# Patient Record
Sex: Female | Born: 1965
Health system: Southern US, Community
[De-identification: ages and names within clinical notes are randomized; demographics above are authoritative.]

## PROBLEM LIST (undated history)

## (undated) DIAGNOSIS — F329 Major depressive disorder, single episode, unspecified: Secondary | ICD-10-CM

## (undated) DIAGNOSIS — F32A Depression, unspecified: Secondary | ICD-10-CM

## (undated) DIAGNOSIS — G43909 Migraine, unspecified, not intractable, without status migrainosus: Secondary | ICD-10-CM

## (undated) HISTORY — DX: Migraine, unspecified, not intractable, without status migrainosus: G43.909

## (undated) HISTORY — DX: Major depressive disorder, single episode, unspecified: F32.9

## (undated) HISTORY — DX: Depression, unspecified: F32.A

---

## 2011-10-27 ENCOUNTER — Other Ambulatory Visit (HOSPITAL_COMMUNITY)
Admission: RE | Admit: 2011-10-27 | Discharge: 2011-10-27 | Disposition: A | Discharge status: Home / Self Care | Payer: 59 | Source: Ambulatory Visit | Attending: Obstetrics and Gynecology | Admitting: Obstetrics and Gynecology

## 2011-10-27 DIAGNOSIS — Z1159 Encounter for screening for other viral diseases: Secondary | ICD-10-CM | POA: Insufficient documentation

## 2011-10-27 DIAGNOSIS — Z01419 Encounter for gynecological examination (general) (routine) without abnormal findings: Secondary | ICD-10-CM | POA: Insufficient documentation

## 2013-01-26 ENCOUNTER — Ambulatory Visit (INDEPENDENT_AMBULATORY_CARE_PROVIDER_SITE_OTHER): Payer: No Typology Code available for payment source | Admitting: Family Medicine

## 2013-01-26 VITALS — BP 104/64 | HR 68 | Temp 98.2°F | Resp 16 | Ht 67.0 in | Wt 152.0 lb

## 2013-01-26 DIAGNOSIS — R350 Frequency of micturition: Secondary | ICD-10-CM

## 2013-01-26 DIAGNOSIS — R3989 Other symptoms and signs involving the genitourinary system: Secondary | ICD-10-CM

## 2013-01-26 LAB — POCT URINALYSIS DIPSTICK
Glucose, UA: NEGATIVE
Ketones, UA: NEGATIVE
Leukocytes, UA: NEGATIVE
Spec Grav, UA: 1.02

## 2013-01-26 LAB — POCT UA - MICROSCOPIC ONLY

## 2013-01-26 MED ORDER — CIPROFLOXACIN HCL 250 MG PO TABS: 250.0000 mg | ORAL_TABLET | Freq: Two times a day (BID) | ORAL | Status: DC

## 2013-01-26 NOTE — Progress Notes (Signed)
 [facility]:  Office Visit  Chief Complaint:  Chief Complaint  Patient presents with  . Urinary Tract Infection    5 days    HPI: Lynn Juarez is a 47 y.o. female who complains of  5 day history of UTI sxs--increase frequency and pressure. Denies dysuira, n/v abd pain.  No fevers, chills, backpain. History of UTI. Tried Cyctex OTC without relief. 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 She used to work at [employer].   Past Medical History  Diagnosis Date  . Depression    History reviewed. No pertinent past surgical history. History   Social History  . Marital Status: Single    Spouse Name: N/A    Number of Children: N/A  . Years of Education: N/A   Social History Main Topics  . Smoking status: Never Smoker   . Smokeless tobacco: None  . Alcohol Use: No  . Drug Use: No  . Sexually Active: Yes    Birth Control/ Protection: IUD   Other Topics Concern  . None   Social History Narrative  . None   Family History  Problem Relation Age of Onset  . Cancer Father    No Known Allergies Prior to Admission medications   Medication Sig Start Date End Date Taking? Authorizing Provider  buPROPion (WELLBUTRIN XL) 150 MG 24 hr tablet Take 150 mg by mouth daily.   Yes Historical Provider, MD     ROS: The patient denies fevers, chills, night sweats, unintentional weight loss, chest pain, palpitations, wheezing, dyspnea on exertion, nausea, vomiting, abdominal pain, dysuria, hematuria, melena, numbness, weakness, or tingling.   All other systems have been reviewed and were otherwise negative with the exception of those mentioned in the HPI and as above.    PHYSICAL EXAM: Filed Vitals:   01/26/13 1721  BP: 104/64  Pulse: 68  Temp: 98.2 F (36.8 C)  Resp: 16   Filed Vitals:   01/26/13 1721  Height: 5\' 7"  (1.702 m)  Weight: 152 lb (68.947 kg)   Body mass index is 23.8 kg/(m^2).  General: Alert, no acute distress HEENT:  Normocephalic,  atraumatic, oropharynx patent.  Cardiovascular:  Regular rate and rhythm, no rubs murmurs or gallops.  No Carotid bruits, radial pulse intact. No pedal edema.  Respiratory: Clear to auscultation bilaterally.  No wheezes, rales, or rhonchi.  No cyanosis, no use of accessory musculature GI: No organomegaly, abdomen is soft and non-tender, positive bowel sounds.  No masses. Skin: No rashes. Neurologic: Facial musculature symmetric. Psychiatric: Patient is appropriate throughout our interaction. Lymphatic: No cervical lymphadenopathy Musculoskeletal: Gait intact. No CVA tenderness   LABS: Results for orders placed in visit on 01/26/13  POCT UA - MICROSCOPIC ONLY      Result Value Range   WBC, Ur, HPF, POC 0-2     RBC, urine, microscopic 0-1     Bacteria, U Microscopic trace     Mucus, UA neg     Epithelial cells, urine per micros 0-2     Crystals, Ur, HPF, POC neg     Casts, Ur, LPF, POC neg     Yeast, UA neg    POCT URINALYSIS DIPSTICK      Result Value Range   Color, UA yellow     Clarity, UA clear     Glucose, UA neg     Bilirubin, UA neg     Ketones, UA neg     Spec Grav, UA 1.020  Blood, UA neg     pH, UA 7.0     Protein, UA neg     Urobilinogen, UA 0.2     Nitrite, UA neg     Leukocytes, UA Negative       EKG/XRAY:   Primary read interpreted by Dr. Conley Rolls at [facility].   ASSESSMENT/PLAN: Encounter Diagnoses  Name Primary?  . Urinary frequency Yes  . Sensation of pressure in bladder area    No UTI currently Will cx urine She will be given Cipro 250 mg BID x 3 days to take if sxs worsen F/u prn    ,  PHUONG, DO 01/26/2013 5:38 PM

## 2013-01-28 LAB — URINE CULTURE
Colony Count: NO GROWTH
Organism ID, Bacteria: NO GROWTH

## 2013-07-28 ENCOUNTER — Other Ambulatory Visit: Payer: Self-pay | Admitting: Family Medicine

## 2013-07-28 ENCOUNTER — Ambulatory Visit
Admission: RE | Admit: 2013-07-28 | Discharge: 2013-07-28 | Disposition: A | Discharge status: Home / Self Care | Payer: No Typology Code available for payment source | Source: Ambulatory Visit | Attending: Family Medicine | Admitting: Family Medicine

## 2013-07-28 DIAGNOSIS — M5431 Sciatica, right side: Secondary | ICD-10-CM

## 2018-03-28 MED FILL — VYVANSE 30 MG CAPSULE: 30 | 30 days supply | Qty: 30 | Fill #0

## 2018-04-26 MED FILL — buPROPion HCL ER (XL) 150 M: 150 | 30 days supply | Qty: 30 | Fill #0

## 2018-04-28 MED FILL — VYVANSE 30 MG CAPSULE: 30 | 30 days supply | Qty: 30 | Fill #0

## 2018-05-25 MED FILL — buPROPion HCL ER (XL) 150 M: 150 | 30 days supply | Qty: 30 | Fill #1

## 2018-05-27 MED FILL — VYVANSE 30 MG CAPSULE: 30 | 30 days supply | Qty: 30 | Fill #0

## 2018-06-27 MED FILL — buPROPion HCL ER (XL) 150 M: 150 | 60 days supply | Qty: 60 | Fill #0

## 2018-06-27 MED FILL — VYVANSE 30 MG CAPSULE: 30 | 30 days supply | Qty: 30 | Fill #0

## 2018-07-28 MED FILL — buPROPion HCL ER (XL) 150 M: 150 | 60 days supply | Qty: 60 | Fill #1

## 2018-07-28 MED FILL — VYVANSE 30 MG CAPSULE: 30 | 30 days supply | Qty: 30 | Fill #0

## 2018-09-01 MED FILL — VYVANSE 30 MG CAPSULE: 30 | 30 days supply | Qty: 30 | Fill #0

## 2018-09-02 MED FILL — buPROPion HCL ER (XL) 300 M: 300 | 30 days supply | Qty: 30 | Fill #0

## 2018-09-26 DIAGNOSIS — F338 Other recurrent depressive disorders: Secondary | ICD-10-CM | POA: Diagnosis not present

## 2018-09-26 DIAGNOSIS — F902 Attention-deficit hyperactivity disorder, combined type: Secondary | ICD-10-CM | POA: Diagnosis not present

## 2018-09-26 DIAGNOSIS — Z79899 Other long term (current) drug therapy: Secondary | ICD-10-CM | POA: Diagnosis not present

## 2018-09-26 MED FILL — buPROPion HCL ER (XL) 150 M: 150 | 30 days supply | Qty: 60 | Fill #0

## 2018-09-29 ENCOUNTER — Encounter: Payer: Self-pay | Admitting: Certified Nurse Midwife

## 2018-10-03 ENCOUNTER — Inpatient Hospital Stay (HOSPITAL_BASED_OUTPATIENT_CLINIC_OR_DEPARTMENT_OTHER): Admission: RE | Admit: 2018-10-03 | Payer: No Typology Code available for payment source | Source: Ambulatory Visit

## 2018-10-03 MED FILL — VYVANSE 30 MG CAPSULE: 30 | 30 days supply | Qty: 30 | Fill #0

## 2018-10-07 ENCOUNTER — Other Ambulatory Visit: Payer: Self-pay

## 2018-10-07 ENCOUNTER — Ambulatory Visit: Payer: 59 | Admitting: Certified Nurse Midwife

## 2018-10-07 ENCOUNTER — Encounter: Payer: Self-pay | Admitting: Certified Nurse Midwife

## 2018-10-07 VITALS — BP 120/80 | HR 64 | Resp 16 | Ht 65.75 in | Wt 148.0 lb

## 2018-10-07 DIAGNOSIS — N6310 Unspecified lump in the right breast, unspecified quadrant: Secondary | ICD-10-CM | POA: Diagnosis not present

## 2018-10-07 NOTE — Progress Notes (Signed)
[facility] appointment for mammogram for Bilateral Breast and possible R Breast Ultrasound 10/18/2018 at 1300.  Will call [facility] locally and see if earlier appointment can be made.

## 2018-10-07 NOTE — Progress Notes (Signed)
   Subjective:   53 y.o. Single Caucasian female here to establish gyn care and  presents for evaluation of right breast mass only.Has not had Gyn exam since 2015 with IUD removal. No history of abnormal paps. Periods normal,no changes. Onset of breast  symptoms was noted 7 days ago by appearance when doing SBE. Breasts are always tender, has decreased green tea and with tenderness becoming less apparent. Always sensitive to touch or exam."Does not like breast exams".. Patient sought evaluation because of breast lump felt after noted appearance change in mirror.   Contributing factors include none,. Denies no health issues that would contribute to finding.. Patient denies history of trauma, bites, or injuries. Last mammogram was 2018 with asymmetry with benign finding.( report in Care everywhere 02/23/17) No other health concerns today. 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   Review of Systems  Review of Systems  Constitutional: Negative.   HENT: Negative.   Eyes: Negative.   Respiratory: Negative.   Cardiovascular: Negative.        Breast tenderness with mass in right  Gastrointestinal: Negative.   Genitourinary: Negative.   Musculoskeletal: Negative.   Skin: Negative.   Neurological: Negative.        History of depression  Endo/Heme/Allergies: Negative.   Psychiatric/Behavioral: Positive for depression.       History of depression on medication with MD management     Objective:   General appearance: alert, cooperative and appears stated age  Skin: warm and dry Physical Exam Cardiovascular:     Rate and Rhythm: Normal rate.  Pulmonary:     Effort: Pulmonary effort is normal.  Chest:     Breasts: Breasts are symmetrical.        Right: Mass and tenderness present. No nipple discharge or skin change.        Left: Normal. No mass, nipple discharge or skin change.    Lymphadenopathy:     Upper Body:     Right upper body: No axillary adenopathy.     Left upper body: No axillary  adenopathy.  Neurological:     Mental Status: She is alert.        Assessment:   ASSESSMENT:Patient is diagnosed with right breast mass with tenderness  Gyn care absent for 5 years.   Plan:  Discussed finding with patient and recommendations of diagnostic mammogram and [place] for evaluation. Patient agreeable. Patient will be scheduled prior to leaving for diagnostic mammogram and [place]. Questions addressed. Discussed importance of gyn care and evaluation for cervical cancer recommended with Pap smear. Discussed would be happy to work with her regarding if she desires. Patient will consider if she feels needed at this point. Questions addressed.  Rv prn

## 2018-10-11 ENCOUNTER — Encounter: Payer: Self-pay | Admitting: Certified Nurse Midwife

## 2018-10-11 DIAGNOSIS — N6315 Unspecified lump in the right breast, overlapping quadrants: Secondary | ICD-10-CM | POA: Diagnosis not present

## 2018-10-11 DIAGNOSIS — N6489 Other specified disorders of breast: Secondary | ICD-10-CM | POA: Diagnosis not present

## 2018-10-11 DIAGNOSIS — N6313 Unspecified lump in the right breast, lower outer quadrant: Secondary | ICD-10-CM | POA: Diagnosis not present

## 2018-10-11 DIAGNOSIS — N6314 Unspecified lump in the right breast, lower inner quadrant: Secondary | ICD-10-CM | POA: Diagnosis not present

## 2018-10-13 ENCOUNTER — Telehealth: Payer: Self-pay | Admitting: Emergency Medicine

## 2018-10-13 NOTE — Telephone Encounter (Signed)
Yes call back if no appt. In 4 weeks.  I will put in my reminder list also

## 2018-10-13 NOTE — Telephone Encounter (Signed)
Can speak with any nurse at return call. Needs breast check appointment for followup.    Contacted patient per Leota Sauerseborah Leonard CNM.  Imaging done at [facility] reviewed from 10/11/2018.   Debbi would like patient to return in two weeks for breast check of mass that she felt on palpation.  Just needs office visit for recheck R breast.   Please schedule.   Message left to return call to [place] at [phone].

## 2018-10-13 NOTE — Telephone Encounter (Signed)
Spoke with patient.  Reviewed mammogram and need for follow up breast check.  Pt declines to schedule appointment at this time.   She is advised of reason for needing breast check, follow up to ensure area has resolved or if not, referral to breast surgeon. Pt advised to continue with monthly self breast examinations and call back if would like to schedule breast check.   Routing to provider.  Okay to close?

## 2018-10-14 NOTE — Telephone Encounter (Signed)
Noted message.  Encounter closed.

## 2018-11-04 MED FILL — buPROPion HCL ER (XL) 150 M: 150 | 30 days supply | Qty: 60 | Fill #1

## 2018-11-04 MED FILL — VYVANSE 30 MG CAPSULE: 30 | 30 days supply | Qty: 30 | Fill #0

## 2019-05-17 NOTE — Progress Notes (Deleted)
53 y.o. [id number] Single  {Race/ethnicity:17218} Fe here for annual exam.    No LMP recorded.          Sexually active: {yes no:314532}  The current method of family planning is {contraception:315051}.    Exercising: {yes no:314532}  {types:19826} Smoker:  {YES NO:22349}  ROS  Health Maintenance: Pap:  *** History of Abnormal Pap: {YES NO:22349} MMG:  10-11-2018 & u/s neg Self Breast exams: {YES NO:22349} Colonoscopy:  *** BMD:   none TDaP:  *** Shingles: no Pneumonia: no Hep C and HIV: *** Labs: ***   reports that she has never smoked. She has never used smokeless tobacco. She reports that she does not drink alcohol or use drugs.  Past Medical History:  Diagnosis Date  . Depression   . Migraines     No past surgical history on file.  Current Outpatient Medications  Medication Sig Dispense Refill  . buPROPion (WELLBUTRIN XL) 300 MG 24 hr tablet     . VYVANSE 30 MG capsule      No current facility-administered medications for this visit.     Family History  Problem Relation Age of Onset  . Lung cancer Father   . Stroke Maternal Grandmother     ROS:  Pertinent items are noted in HPI.  Otherwise, a comprehensive ROS was negative.  Exam:   There were no vitals taken for this visit.   Ht Readings from Last 3 Encounters:  10/07/18 5' 5.75" (1.67 m)  01/26/13 5\' 7"  (1.702 m)    General appearance: alert, cooperative and appears stated age Head: Normocephalic, without obvious abnormality, atraumatic Neck: no adenopathy, supple, symmetrical, trachea midline and thyroid {EXAM; THYROID:18604} Lungs: clear to auscultation bilaterally Breasts: {Exam; breast:13139::"normal appearance, no masses or tenderness"} Heart: regular rate and rhythm Abdomen: soft, non-tender; no masses,  no organomegaly Extremities: extremities normal, atraumatic, no cyanosis or edema Skin: Skin color, texture, turgor normal. No rashes or lesions Lymph nodes: Cervical, supraclavicular, and  axillary nodes normal. No abnormal inguinal nodes palpated Neurologic: Grossly normal   Pelvic: External genitalia:  no lesions              Urethra:  normal appearing urethra with no masses, tenderness or lesions              Bartholin's and Skene's: normal                 Vagina: normal appearing vagina with normal color and discharge, no lesions              Cervix: {exam; cervix:14595}              Pap taken: {yes no:314532} Bimanual Exam:  Uterus:  {exam; uterus:12215}              Adnexa: {exam; adnexa:12223}               Rectovaginal: Confirms               Anus:  normal sphincter tone, no lesions  Chaperone present: ***  A:  Well Woman with normal exam  P:   Reviewed health and wellness pertinent to exam  Pap smear: {YES NO:22349}  {plan; gyn:5269::"mammogram","pap smear","return annually or prn"}  An After Visit Summary was printed and given to the patient.

## 2019-05-24 ENCOUNTER — Ambulatory Visit: Payer: 59 | Admitting: Certified Nurse Midwife

## 2019-06-05 ENCOUNTER — Ambulatory Visit: Payer: Self-pay | Admitting: Certified Nurse Midwife

## 2019-06-19 ENCOUNTER — Other Ambulatory Visit: Payer: Self-pay | Admitting: *Deleted

## 2019-06-19 ENCOUNTER — Other Ambulatory Visit (HOSPITAL_COMMUNITY)
Admission: RE | Admit: 2019-06-19 | Discharge: 2019-06-19 | Disposition: A | Discharge status: Home / Self Care | Payer: Federal, State, Local not specified - PPO | Source: Ambulatory Visit | Attending: Obstetrics & Gynecology | Admitting: Obstetrics & Gynecology

## 2019-06-19 ENCOUNTER — Encounter: Payer: Self-pay | Admitting: Certified Nurse Midwife

## 2019-06-19 ENCOUNTER — Ambulatory Visit: Payer: Federal, State, Local not specified - PPO | Admitting: Certified Nurse Midwife

## 2019-06-19 ENCOUNTER — Other Ambulatory Visit: Payer: Self-pay

## 2019-06-19 VITALS — BP 110/70 | HR 64 | Temp 97.1°F | Resp 16 | Ht 65.75 in | Wt 145.0 lb

## 2019-06-19 DIAGNOSIS — Z124 Encounter for screening for malignant neoplasm of cervix: Secondary | ICD-10-CM | POA: Diagnosis not present

## 2019-06-19 DIAGNOSIS — Z01419 Encounter for gynecological examination (general) (routine) without abnormal findings: Secondary | ICD-10-CM

## 2019-06-19 DIAGNOSIS — Z0000 Encounter for general adult medical examination without abnormal findings: Secondary | ICD-10-CM

## 2019-06-19 DIAGNOSIS — E041 Nontoxic single thyroid nodule: Secondary | ICD-10-CM

## 2019-06-19 DIAGNOSIS — E559 Vitamin D deficiency, unspecified: Secondary | ICD-10-CM | POA: Diagnosis not present

## 2019-06-19 DIAGNOSIS — N951 Menopausal and female climacteric states: Secondary | ICD-10-CM

## 2019-06-19 DIAGNOSIS — Z1211 Encounter for screening for malignant neoplasm of colon: Secondary | ICD-10-CM

## 2019-06-19 NOTE — Patient Instructions (Addendum)
EXERCISE AND DIET:  We recommended that you start or continue a regular exercise program for good health. Regular exercise means any activity that makes your heart beat faster and makes you sweat.  We recommend exercising at least 30 minutes per day at least 3 days a week, preferably 4 or 5.  We also recommend a diet low in fat and sugar.  Inactivity, poor dietary choices and obesity can cause diabetes, heart attack, stroke, and kidney damage, among others.    ALCOHOL AND SMOKING:  Women should limit their alcohol intake to no more than 7 drinks/beers/glasses of wine (combined, not each!) per week. Moderation of alcohol intake to this level decreases your risk of breast cancer and liver damage. And of course, no recreational drugs are part of a healthy lifestyle.  And absolutely no smoking or even second hand smoke. Most people know smoking can cause heart and lung diseases, but did you know it also contributes to weakening of your bones? Aging of your skin?  Yellowing of your teeth and nails?  CALCIUM AND VITAMIN D:  Adequate intake of calcium and Vitamin D are recommended.  The recommendations for exact amounts of these supplements seem to change often, but generally speaking 600 mg of calcium (either carbonate or citrate) and 800 units of Vitamin D per day seems prudent. Certain women may benefit from higher intake of Vitamin D.  If you are among these women, your doctor will have told you during your visit.    PAP SMEARS:  Pap smears, to check for cervical cancer or precancers,  have traditionally been done yearly, although recent scientific advances have shown that most women can have pap smears less often.  However, every woman still should have a physical exam from her gynecologist every year. It will include a breast check, inspection of the vulva and vagina to check for abnormal growths or skin changes, a visual exam of the cervix, and then an exam to evaluate the size and shape of the uterus and  ovaries.  And after 51 years of age, a rectal exam is indicated to check for rectal cancers. We will also provide age appropriate advice regarding health maintenance, like when you should have certain vaccines, screening for sexually transmitted diseases, bone density testing, colonoscopy, mammograms, etc.   MAMMOGRAMS:  All women over 40 years old should have a yearly mammogram. Many facilities now offer a "3D" mammogram, which may cost around $50 extra out of pocket. If possible,  we recommend you accept the option to have the 3D mammogram performed.  It both reduces the number of women who will be called back for extra views which then turn out to be normal, and it is better than the routine mammogram at detecting truly abnormal areas.    COLONOSCOPY:  Colonoscopy to screen for colon cancer is recommended for all women at age 50.  We know, you hate the idea of the prep.  We agree, BUT, having colon cancer and not knowing it is worse!!  Colon cancer so often starts as a polyp that can be seen and removed at colonscopy, which can quite literally save your life!  And if your first colonoscopy is normal and you have no family history of colon cancer, most women don't have to have it again for 10 years.  Once every ten years, you can do something that may end up saving your life, right?  We will be happy to help you get it scheduled when you are ready.    Be sure to check your insurance coverage so you understand how much it will cost.  It may be covered as a preventative service at no cost, but you should check your particular policy.     Levonorgestrel intrauterine device (IUD) What is this medicine? LEVONORGESTREL IUD (LEE voe nor jes trel) is a contraceptive (birth control) device. The device is placed inside the uterus by a healthcare professional. It is used to prevent pregnancy. This device can also be used to treat heavy bleeding that occurs during your period. This medicine may be used for other  purposes; ask your health care provider or pharmacist if you have questions. COMMON BRAND NAME(S): Kyleena, LILETTA, Mirena, Skyla What should I tell my health care provider before I take this medicine? They need to know if you have any of these conditions:  abnormal Pap smear  cancer of the breast, uterus, or cervix  diabetes  endometritis  genital or pelvic infection now or in the past  have more than one sexual partner or your partner has more than one partner  heart disease  history of an ectopic or tubal pregnancy  immune system problems  IUD in place  liver disease or tumor  problems with blood clots or take blood-thinners  seizures  use intravenous drugs  uterus of unusual shape  vaginal bleeding that has not been explained  an unusual or allergic reaction to levonorgestrel, other hormones, silicone, or polyethylene, medicines, foods, dyes, or preservatives  pregnant or trying to get pregnant  breast-feeding How should I use this medicine? This device is placed inside the uterus by a health care professional. Talk to your pediatrician regarding the use of this medicine in children. Special care may be needed. Overdosage: If you think you have taken too much of this medicine contact a poison control center or emergency room at once. NOTE: This medicine is only for you. Do not share this medicine with others. What if I miss a dose? This does not apply. Depending on the brand of device you have inserted, the device will need to be replaced every 3 to 6 years if you wish to continue using this type of birth control. What may interact with this medicine? Do not take this medicine with any of the following medications:  amprenavir  bosentan  fosamprenavir This medicine may also interact with the following medications:  aprepitant  armodafinil  barbiturate medicines for inducing sleep or treating  seizures  bexarotene  boceprevir  griseofulvin  medicines to treat seizures like carbamazepine, ethotoin, felbamate, oxcarbazepine, phenytoin, topiramate  modafinil  pioglitazone  rifabutin  rifampin  rifapentine  some medicines to treat HIV infection like atazanavir, efavirenz, indinavir, lopinavir, nelfinavir, tipranavir, ritonavir  St. John's wort  warfarin This list may not describe all possible interactions. Give your health care provider a list of all the medicines, herbs, non-prescription drugs, or dietary supplements you use. Also tell them if you smoke, drink alcohol, or use illegal drugs. Some items may interact with your medicine. What should I watch for while using this medicine? Visit your doctor or health care professional for regular check ups. See your doctor if you or your partner has sexual contact with others, becomes HIV positive, or gets a sexual transmitted disease. This product does not protect you against HIV infection (AIDS) or other sexually transmitted diseases. You can check the placement of the IUD yourself by reaching up to the top of your vagina with clean fingers to feel the threads. Do not pull   on the threads. It is a good habit to check placement after each menstrual period. Call your doctor right away if you feel more of the IUD than just the threads or if you cannot feel the threads at all. The IUD may come out by itself. You may become pregnant if the device comes out. If you notice that the IUD has come out use a backup birth control method like condoms and call your health care provider. Using tampons will not change the position of the IUD and are okay to use during your period. This IUD can be safely scanned with magnetic resonance imaging (MRI) only under specific conditions. Before you have an MRI, tell your healthcare provider that you have an IUD in place, and which type of IUD you have in place. What side effects may I notice from  receiving this medicine? Side effects that you should report to your doctor or health care professional as soon as possible:  allergic reactions like skin rash, itching or hives, swelling of the face, lips, or tongue  fever, flu-like symptoms  genital sores  high blood pressure  no menstrual period for 6 weeks during use  pain, swelling, warmth in the leg  pelvic pain or tenderness  severe or sudden headache  signs of pregnancy  stomach cramping  sudden shortness of breath  trouble with balance, talking, or walking  unusual vaginal bleeding, discharge  yellowing of the eyes or skin Side effects that usually do not require medical attention (report to your doctor or health care professional if they continue or are bothersome):  acne  breast pain  change in sex drive or performance  changes in weight  cramping, dizziness, or faintness while the device is being inserted  headache  irregular menstrual bleeding within first 3 to 6 months of use  nausea This list may not describe all possible side effects. Call your doctor for medical advice about side effects. You may report side effects to FDA at 1-800-FDA-1088. Where should I keep my medicine? This does not apply. NOTE: This sheet is a summary. It may not cover all possible information. If you have questions about this medicine, talk to your doctor, pharmacist, or health care provider.  2020 Elsevier/Gold Standard (2018-07-12 13:22:01)  

## 2019-06-19 NOTE — Progress Notes (Addendum)
53 y.o. [id number] Single  Caucasian Fe here for annual exam. Periods regular, monthly 1-2 heavy days and then light, no cramping. Eating well and drinking adequate amounts of water. No missed periods in past year. Desires IUD insertion for cycle control and transition to menopause. She has had Mirena before and feels this is good choice again. No other health issues today. Screening labs, no PCP established as of yet.  Patient's last menstrual period was 06/05/2019.          Sexually active: No.  The current method of family planning is abstinence.    Exercising: Yes.    walking & free weights Smoker:  no  Review of Systems  Constitutional: Negative.   HENT: Negative.   Eyes: Negative.   Respiratory: Negative.   Cardiovascular: Negative.   Gastrointestinal: Negative.   Genitourinary: Negative.   Musculoskeletal: Negative.   Skin: Negative.   Neurological: Negative.   Endo/Heme/Allergies: Negative.   Psychiatric/Behavioral: Negative.     Health Maintenance: Pap:  102yrs ago per patient History of Abnormal Pap: no per patient MMG:  10-11-2018 bilateral & bilateral u/s birads 1:neg Self Breast exams: yes Colonoscopy:  none BMD:   none TDaP:  UTD maybe done last year Shingles: no Pneumonia: no Hep C and HIV: not done Labs: if needed.   reports that she has never smoked. She has never used smokeless tobacco. She reports that she does not drink alcohol or use drugs.  Past Medical History:  Diagnosis Date  . Depression   . Migraines     History reviewed. No pertinent surgical history.  Current Outpatient Medications  Medication Sig Dispense Refill  . buPROPion (WELLBUTRIN XL) 300 MG 24 hr tablet     . Multiple Vitamin (MULTI-VITAMIN) tablet Take by mouth as needed.    Marland Kitchen VYVANSE 50 MG capsule      No current facility-administered medications for this visit.     Family History  Problem Relation Age of Onset  . Lung cancer Father   . Stroke Maternal Grandmother     ROS:   Pertinent items are noted in HPI.  Otherwise, a comprehensive ROS was negative.  Exam:   BP 110/70   Pulse 64   Temp (!) 97.1 F (36.2 C) (Skin)   Resp 16   Ht 5' 5.75" (1.67 m)   Wt 145 lb (65.8 kg)   LMP 06/05/2019   BMI 23.58 kg/m  Height: 5' 5.75" (167 cm) Ht Readings from Last 3 Encounters:  06/19/19 5' 5.75" (1.67 m)  10/07/18 5' 5.75" (1.67 m)  01/26/13 5\' 7"  (1.702 m)    General appearance: alert, cooperative and appears stated age Head: Normocephalic, without obvious abnormality, atraumatic Neck: no adenopathy, supple, symmetrical, trachea midline and thyroid small ? Nodule noted on right. Lungs: clear to auscultation bilaterally Breasts: normal appearance, no masses or tenderness, No nipple retraction or dimpling, No nipple discharge or bleeding, No axillary or supraclavicular adenopathy Heart: regular rate and rhythm Abdomen: soft, non-tender; no masses,  no organomegaly Extremities: extremities normal, atraumatic, no cyanosis or edema Skin: Skin color, texture, turgor normal. No rashes or lesions Lymph nodes: Cervical, supraclavicular, and axillary nodes normal. No abnormal inguinal nodes palpated Neurologic: Grossly normal   Pelvic: External genitalia:  no lesions              Urethra:  normal appearing urethra with no masses, tenderness or lesions              Bartholin's and Skene's: normal  Vagina: normal appearing vagina with normal color and discharge, no lesions              Cervix: no bleeding following Pap, no cervical motion tenderness, no lesions and normal appearance              Pap taken: Yes.   Bimanual Exam:  Uterus:  normal size, contour, position, consistency, mobility, non-tender and mid position              Adnexa: normal adnexa and no mass, fullness, tenderness               Rectovaginal: Confirms               Anus:  normal sphincter tone, no lesions  Chaperone present: yes  A:  Well Woman with normal exam  Contraception  not sexually active  Perimenopausal desires Mirena IUD for cycle management (had previously with no issues)  ? Right thyroid nodule    P:   Reviewed health and wellness pertinent to exam  Discussed risks/benefits/warning signs with Mirena IUD. Request scheduling. She will be called with insurance information and will need to call on day 1-5 for insertion.  Discussed ? Finding of thyroid nodule and need to evaluate with [place]. Will be called with information regarding.  Pap smear: yes   counseled on breast self exam, mammography screening, feminine hygiene, menopause, adequate intake of calcium and vitamin D, diet and exercise  return annually or prn  An After Visit Summary was printed and given to the patient.

## 2019-06-20 ENCOUNTER — Other Ambulatory Visit: Payer: Self-pay | Admitting: Certified Nurse Midwife

## 2019-06-20 DIAGNOSIS — E559 Vitamin D deficiency, unspecified: Secondary | ICD-10-CM

## 2019-06-20 LAB — CBC
Hematocrit: 42.3 % (ref 34.0–46.6)
Hemoglobin: 13.6 g/dL (ref 11.1–15.9)
MCH: 29.4 pg (ref 26.6–33.0)
MCHC: 32.2 g/dL (ref 31.5–35.7)
MCV: 92 fL (ref 79–97)
Platelets: 281 10*3/uL (ref 150–450)
RBC: 4.62 x10E6/uL (ref 3.77–5.28)
RDW: 11.9 % (ref 11.7–15.4)
WBC: 8 10*3/uL (ref 3.4–10.8)

## 2019-06-20 LAB — COMPREHENSIVE METABOLIC PANEL
ALT: 19 IU/L (ref 0–32)
AST: 21 IU/L (ref 0–40)
Albumin/Globulin Ratio: 2.3 — ABNORMAL HIGH (ref 1.2–2.2)
Albumin: 4.5 g/dL (ref 3.8–4.8)
Alkaline Phosphatase: 62 IU/L (ref 39–117)
BUN/Creatinine Ratio: 19 (ref 9–23)
BUN: 14 mg/dL (ref 6–24)
Bilirubin Total: 0.5 mg/dL (ref 0.0–1.2)
CO2: 26 mmol/L (ref 20–29)
Calcium: 9.5 mg/dL (ref 8.7–10.2)
Chloride: 100 mmol/L (ref 96–106)
Creatinine, Ser: 0.72 mg/dL (ref 0.57–1.00)
GFR calc Af Amer: 113 mL/min/{1.73_m2} (ref >59)
GFR calc non Af Amer: 98 mL/min/{1.73_m2} (ref >59)
Globulin, Total: 2 g/dL (ref 1.5–4.5)
Glucose: 98 mg/dL (ref 65–99)
Potassium: 4.2 mmol/L (ref 3.5–5.2)
Sodium: 139 mmol/L (ref 134–144)
Total Protein: 6.5 g/dL (ref 6.0–8.5)

## 2019-06-20 LAB — TSH: TSH: 2.77 u[IU]/mL (ref 0.450–4.500)

## 2019-06-20 LAB — LIPID PANEL
Chol/HDL Ratio: 2.2 ratio (ref 0.0–4.4)
Cholesterol, Total: 184 mg/dL (ref 100–199)
HDL: 83 mg/dL (ref >39)
LDL Chol Calc (NIH): 89 mg/dL (ref 0–99)
Triglycerides: 64 mg/dL (ref 0–149)
VLDL Cholesterol Cal: 12 mg/dL (ref 5–40)

## 2019-06-20 LAB — VITAMIN D 25 HYDROXY (VIT D DEFICIENCY, FRACTURES): Vit D, 25-Hydroxy: 26.8 ng/mL — ABNORMAL LOW (ref 30.0–100.0)

## 2019-06-21 ENCOUNTER — Telehealth: Payer: Self-pay | Admitting: Certified Nurse Midwife

## 2019-06-21 NOTE — Telephone Encounter (Signed)
Call placed to convey benefits for Mirena. °

## 2019-06-26 LAB — CYTOLOGY - PAP
Diagnosis: NEGATIVE
High risk HPV: NEGATIVE

## 2019-06-29 NOTE — Telephone Encounter (Signed)
Patient returned call. Reviewed benefit for a Mirena IUD insertion. Patient acknowledges understanding of information presented. Patient is ready to scheduled. Patient states she should start her cycle "any day now" . Patient to call back to office on the first day of her cycle to schedule. No further questions. Will close encounter

## 2019-06-30 ENCOUNTER — Ambulatory Visit
Admission: RE | Admit: 2019-06-30 | Discharge: 2019-06-30 | Disposition: A | Discharge status: Home / Self Care | Payer: Federal, State, Local not specified - PPO | Source: Ambulatory Visit | Attending: Certified Nurse Midwife | Admitting: Certified Nurse Midwife

## 2019-06-30 DIAGNOSIS — E041 Nontoxic single thyroid nodule: Secondary | ICD-10-CM

## 2019-07-03 ENCOUNTER — Telehealth: Payer: Self-pay | Admitting: Certified Nurse Midwife

## 2019-07-03 NOTE — Telephone Encounter (Signed)
Patient started period Sunday and she's calling to schedule IUD insertion.

## 2019-07-03 NOTE — Telephone Encounter (Signed)
Spoke with patient. Patient seen in office on 06/19/19 for AEX, request to proceed with Mirena IUD insertion. G2P2. LMP 07/02/19. Mirena IUD insertion scheduled for 10/20 at 8am with Melvia Heaps, CNM. Advised to take Motrin 800 mg with food and water one hour before procedure. [id number] prescreen negative, precautions reviewed.   Order previously placed.   Routing to provider for final review. Patient is agreeable to disposition. Will close encounter.  Cc: Lerry Liner, Magdalene Patricia

## 2019-07-04 ENCOUNTER — Encounter: Payer: Self-pay | Admitting: Certified Nurse Midwife

## 2019-07-04 ENCOUNTER — Ambulatory Visit: Payer: Self-pay

## 2019-07-04 ENCOUNTER — Ambulatory Visit (INDEPENDENT_AMBULATORY_CARE_PROVIDER_SITE_OTHER): Payer: Federal, State, Local not specified - PPO | Admitting: Certified Nurse Midwife

## 2019-07-04 ENCOUNTER — Other Ambulatory Visit: Payer: Self-pay

## 2019-07-04 VITALS — BP 108/68 | HR 68 | Temp 97.0°F | Resp 16 | Wt 148.0 lb

## 2019-07-04 DIAGNOSIS — Z01812 Encounter for preprocedural laboratory examination: Secondary | ICD-10-CM | POA: Diagnosis not present

## 2019-07-04 DIAGNOSIS — N951 Menopausal and female climacteric states: Secondary | ICD-10-CM

## 2019-07-04 LAB — POCT URINE PREGNANCY: Preg Test, Ur: NEGATIVE

## 2019-07-04 NOTE — Patient Instructions (Signed)

## 2019-07-04 NOTE — Progress Notes (Signed)
89 yrs Married Caucasian female presents for  insertion of Mirena IUD for cycle control and perimenopausal/contraception.  Denies any vaginal symptoms or STD concerns. Patient took 400 mg Advil prior to arriving. Period has been heavy to moderate to light this time, She is on day 3. No other health issues today.  LMP 07-02-2019  Patient read information regarding IUD insertion.  All questions addressed.    Healthy female WDWN Affect: normal, Orientation X 3 Abdomen: soft, non-tender Groin:no inguinal nodes palpated  Pelvic exam: Vulva: normal female genitalia, no lesions  Vagina: normal appearance, with blood noted in vaginal vaultf Cervix: Non-tender, Negative CMT, no lesions or redness,parous appearance Uterus:normal shape, position and consistency, mid position, non tender Adnexa: no fullness or masses noted, palpate normal   Lab: Negative UPT  Procedure:  Speculum inserted into vagina. Cervix visualized and cleansed with betadine solution X 3. Tenaculum placed on cervix at 10 and 2 o'clock  position(s).  Uterus sounded to 7 centimeters.  IUD removed from sterile packet and under sterile conditions inserted to fundus of uterus.  Introducer removed without difficulty.  IUD string trimmed to 3 centimeters.  Remainder string given to patient to feel for identification.  Tenaculum removed.  scant bleeding noted. Silver nitrate applied to tenaculum site. No bleeding noted upon speculum removal. Uterus palpated normal.  Patient tolerated procedure well.  A: Insertion of Mirena, Lot # [id number], Expiration date November 2022   P:  Instructions and warnings signs given, written and verbal. Patient voiced understanding.       IUD identification card given with IUD removal 07/03/2024.        Return visit one month, prn

## 2019-07-29 ENCOUNTER — Encounter: Payer: Self-pay | Admitting: Certified Nurse Midwife

## 2019-08-07 ENCOUNTER — Other Ambulatory Visit: Payer: Self-pay

## 2019-08-07 ENCOUNTER — Encounter: Payer: Self-pay | Admitting: Certified Nurse Midwife

## 2019-08-07 ENCOUNTER — Ambulatory Visit: Payer: Federal, State, Local not specified - PPO | Admitting: Certified Nurse Midwife

## 2019-08-07 VITALS — BP 100/70 | HR 68 | Temp 97.1°F | Resp 16 | Wt 150.0 lb

## 2019-08-07 DIAGNOSIS — Z01419 Encounter for gynecological examination (general) (routine) without abnormal findings: Secondary | ICD-10-CM

## 2019-08-07 DIAGNOSIS — Z30431 Encounter for routine checking of intrauterine contraceptive device: Secondary | ICD-10-CM | POA: Diagnosis not present

## 2019-08-07 NOTE — Patient Instructions (Signed)
Levonorgestrel intrauterine device (IUD) What is this medicine? LEVONORGESTREL IUD (LEE voe nor jes trel) is a contraceptive (birth control) device. The device is placed inside the uterus by a healthcare professional. It is used to prevent pregnancy. This device can also be used to treat heavy bleeding that occurs during your period. This medicine may be used for other purposes; ask your health care provider or pharmacist if you have questions. COMMON BRAND NAME(S): Kyleena, LILETTA, Mirena, Skyla What should I tell my health care provider before I take this medicine? They need to know if you have any of these conditions:  abnormal Pap smear  cancer of the breast, uterus, or cervix  diabetes  endometritis  genital or pelvic infection now or in the past  have more than one sexual partner or your partner has more than one partner  heart disease  history of an ectopic or tubal pregnancy  immune system problems  IUD in place  liver disease or tumor  problems with blood clots or take blood-thinners  seizures  use intravenous drugs  uterus of unusual shape  vaginal bleeding that has not been explained  an unusual or allergic reaction to levonorgestrel, other hormones, silicone, or polyethylene, medicines, foods, dyes, or preservatives  pregnant or trying to get pregnant  breast-feeding How should I use this medicine? This device is placed inside the uterus by a health care professional. Talk to your pediatrician regarding the use of this medicine in children. Special care may be needed. Overdosage: If you think you have taken too much of this medicine contact a poison control center or emergency room at once. NOTE: This medicine is only for you. Do not share this medicine with others. What if I miss a dose? This does not apply. Depending on the brand of device you have inserted, the device will need to be replaced every 3 to 6 years if you wish to continue using this type  of birth control. What may interact with this medicine? Do not take this medicine with any of the following medications:  amprenavir  bosentan  fosamprenavir This medicine may also interact with the following medications:  aprepitant  armodafinil  barbiturate medicines for inducing sleep or treating seizures  bexarotene  boceprevir  griseofulvin  medicines to treat seizures like carbamazepine, ethotoin, felbamate, oxcarbazepine, phenytoin, topiramate  modafinil  pioglitazone  rifabutin  rifampin  rifapentine  some medicines to treat HIV infection like atazanavir, efavirenz, indinavir, lopinavir, nelfinavir, tipranavir, ritonavir  St. John's wort  warfarin This list may not describe all possible interactions. Give your health care provider a list of all the medicines, herbs, non-prescription drugs, or dietary supplements you use. Also tell them if you smoke, drink alcohol, or use illegal drugs. Some items may interact with your medicine. What should I watch for while using this medicine? Visit your doctor or health care professional for regular check ups. See your doctor if you or your partner has sexual contact with others, becomes HIV positive, or gets a sexual transmitted disease. This product does not protect you against HIV infection (AIDS) or other sexually transmitted diseases. You can check the placement of the IUD yourself by reaching up to the top of your vagina with clean fingers to feel the threads. Do not pull on the threads. It is a good habit to check placement after each menstrual period. Call your doctor right away if you feel more of the IUD than just the threads or if you cannot feel the threads at   all. The IUD may come out by itself. You may become pregnant if the device comes out. If you notice that the IUD has come out use a backup birth control method like condoms and call your health care provider. Using tampons will not change the position of the  IUD and are okay to use during your period. This IUD can be safely scanned with magnetic resonance imaging (MRI) only under specific conditions. Before you have an MRI, tell your healthcare provider that you have an IUD in place, and which type of IUD you have in place. What side effects may I notice from receiving this medicine? Side effects that you should report to your doctor or health care professional as soon as possible:  allergic reactions like skin rash, itching or hives, swelling of the face, lips, or tongue  fever, flu-like symptoms  genital sores  high blood pressure  no menstrual period for 6 weeks during use  pain, swelling, warmth in the leg  pelvic pain or tenderness  severe or sudden headache  signs of pregnancy  stomach cramping  sudden shortness of breath  trouble with balance, talking, or walking  unusual vaginal bleeding, discharge  yellowing of the eyes or skin Side effects that usually do not require medical attention (report to your doctor or health care professional if they continue or are bothersome):  acne  breast pain  change in sex drive or performance  changes in weight  cramping, dizziness, or faintness while the device is being inserted  headache  irregular menstrual bleeding within first 3 to 6 months of use  nausea This list may not describe all possible side effects. Call your doctor for medical advice about side effects. You may report side effects to FDA at 1-800-FDA-1088. Where should I keep my medicine? This does not apply. NOTE: This sheet is a summary. It may not cover all possible information. If you have questions about this medicine, talk to your doctor, pharmacist, or health care provider.  2020 Elsevier/Gold Standard (2018-07-12 13:22:01)  

## 2019-08-07 NOTE — Progress Notes (Signed)
Review of Systems  Constitutional: Negative.   HENT: Negative.   Eyes: Negative.   Respiratory: Negative.   Cardiovascular: Negative.   Gastrointestinal: Negative.   Genitourinary: Negative.   Musculoskeletal: Negative.   Skin: Negative.   Neurological: Positive for headaches.       Migraines with aura history, no change Stress and tension  Psychiatric/Behavioral: Negative.     53 y.o. Married Caucasian G2P2002here for evaluation of Mirena IUD inserted on initiated on 07/04/19. Menses duration 3 days with very light dark flow. Minimal cramping since insertion. Happy with choice. No other health issues today.  O: Healthy female, WD WN Affect: normal orientation X 3   Physical Exam Exam conducted with a chaperone present.  Constitutional:      Appearance: Normal appearance.  Cardiovascular:     Rate and Rhythm: Normal rate.  Pulmonary:     Effort: Pulmonary effort is normal.  Abdominal:     Palpations: Abdomen is soft.  Genitourinary:    General: Normal vulva.     Exam position: Lithotomy position.     Labia:        Right: No rash, tenderness or lesion.        Left: No rash, tenderness or lesion.      Vagina: Normal.     Cervix: No discharge, lesion or cervical bleeding.     Uterus: Normal.      Adnexa: Right adnexa normal.       Right: No mass, tenderness or fullness.         Left: Mass present. No tenderness or fullness.       Lymphadenopathy:     Lower Body: No right inguinal adenopathy. No left inguinal adenopathy.  Skin:    General: Skin is warm and dry.     Capillary Refill: Capillary refill takes more than 3 seconds.  Neurological:     Mental Status: She is alert and oriented to person, place, and time.        A: History of Mirena IUD insertion with normal follow up. IUD string visualized. Normal pelvic exam  P: Discussed normal finding with pelvic exam and IUD string appropriate length. Warning signs with IUD given and bleeding expectations.  Questions addressed.  Rv prn, aex

## 2019-09-18 ENCOUNTER — Other Ambulatory Visit: Payer: Federal, State, Local not specified - PPO

## 2019-09-21 LAB — FECAL OCCULT BLOOD, IMMUNOCHEMICAL

## 2019-09-22 ENCOUNTER — Telehealth: Payer: Self-pay | Admitting: *Deleted

## 2019-09-22 NOTE — Telephone Encounter (Addendum)
Message left to return call to [facility] at [phone].   Need to advise patient that IFOB test was not performed due to age of the specimen. Patient will need to complete test again.

## 2019-09-27 NOTE — Telephone Encounter (Signed)
Message left to return call to Ladavia Lindenbaum at [phone].    

## 2019-10-30 NOTE — Telephone Encounter (Signed)
Can we send letter?

## 2019-10-30 NOTE — Telephone Encounter (Signed)
Patient has not returned call x2 regarding IFOB testing. Please advise.   Routing to provider for review.

## 2019-10-31 NOTE — Telephone Encounter (Signed)
Letter to Debbi to review and sign.

## 2019-10-31 NOTE — Telephone Encounter (Signed)
Letter mailed to patient. Will await response.

## 2019-11-13 NOTE — Telephone Encounter (Signed)
Ok to close encounter, feel adequate trial to notify patient

## 2019-11-13 NOTE — Telephone Encounter (Signed)
Letter mailed to patient on 10-31-19. Patient has not returned call to office. Okay to close encounter?   Routing to provider for review.

## 2019-11-13 NOTE — Telephone Encounter (Signed)
Encounter closed per Debbi.

## 2019-12-04 ENCOUNTER — Encounter: Payer: Self-pay | Admitting: Certified Nurse Midwife

## 2020-01-17 ENCOUNTER — Telehealth: Payer: Self-pay | Admitting: *Deleted

## 2020-01-17 NOTE — Telephone Encounter (Signed)
Left message to call Noreene Larsson, RN at [facility] [phone].   RX request received from [employer] for compression garments.  Reviewed with Dr. Edward Jolly, patient needs to see PCP for evaluation and Rx.

## 2020-01-24 NOTE — Telephone Encounter (Signed)
Call to patient, left message advising will send MyChart message, return call to office if any additional questions.   Unable to leave detailed message.   MyChart message to patient.   Copy of Rx request to scan.   Routing to Dr. Marjorie Smolder.   Encounter closed.

## 2020-12-03 IMAGING — US US THYROID
1 series · 14 of 25 positions shown · non-contrast
Comparison: None.

CLINICAL DATA: Possible thyroid nodule.

EXAM:
THYROID ULTRASOUND
TECHNIQUE: Ultrasound examination of the thyroid gland and adjacent soft
tissues was performed.

[Series 1: us thyroid · 0.04mm/px · 14 of 64 slices shown]
[im 1/64]
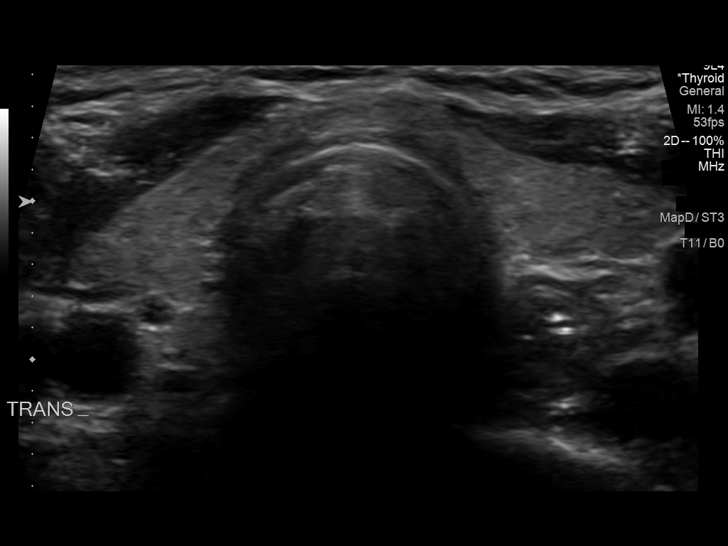
[im 6/64]
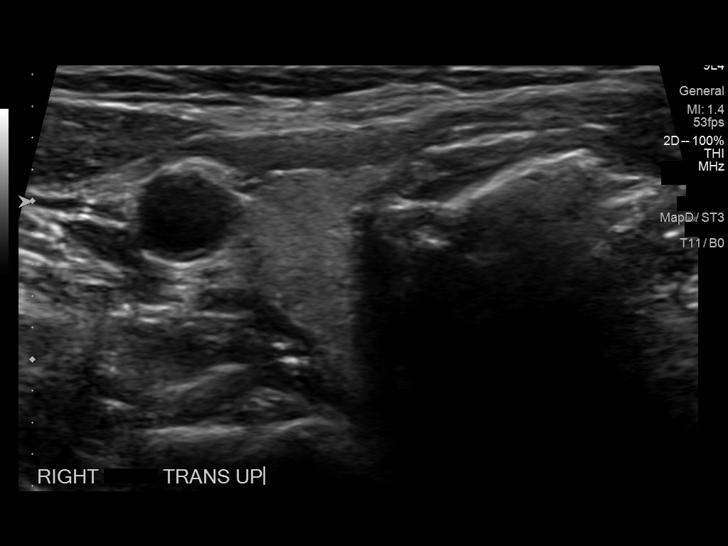
[im 11/64]
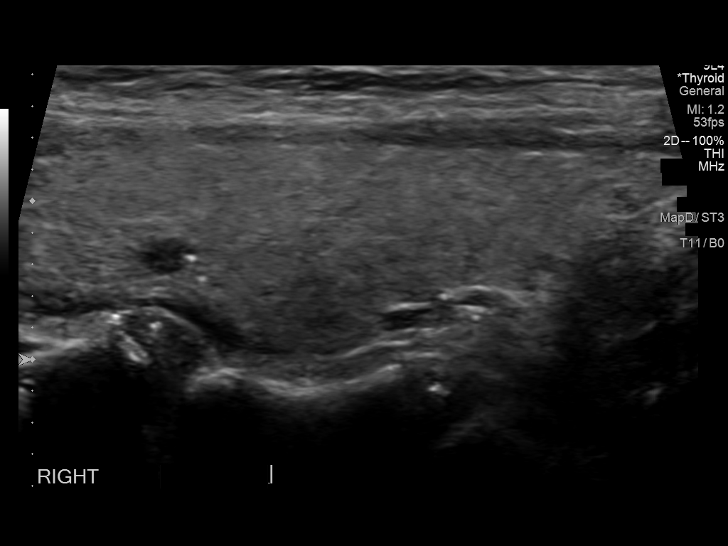
[im 16/64]
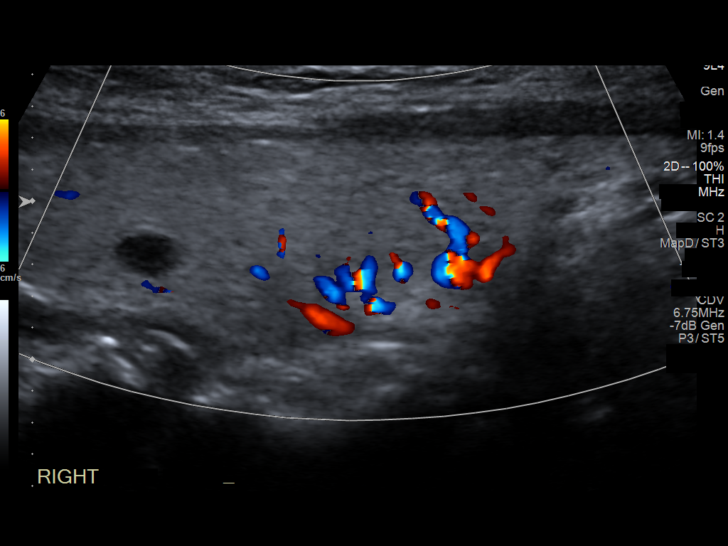
[im 22/64]
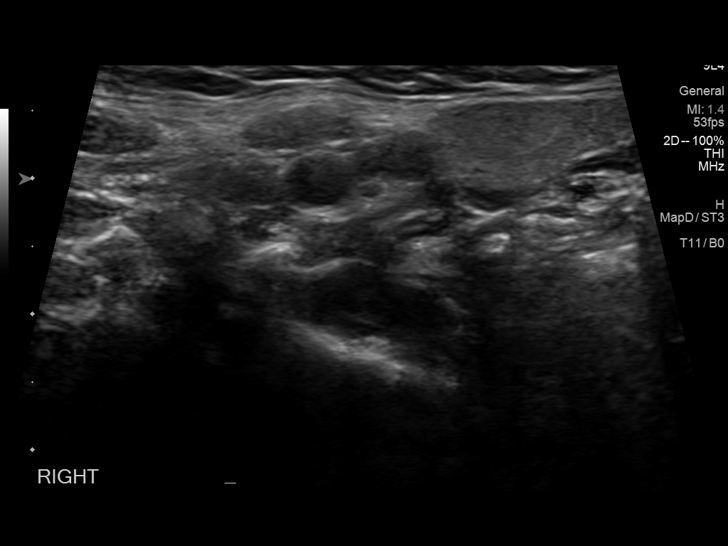
[im 24/64]
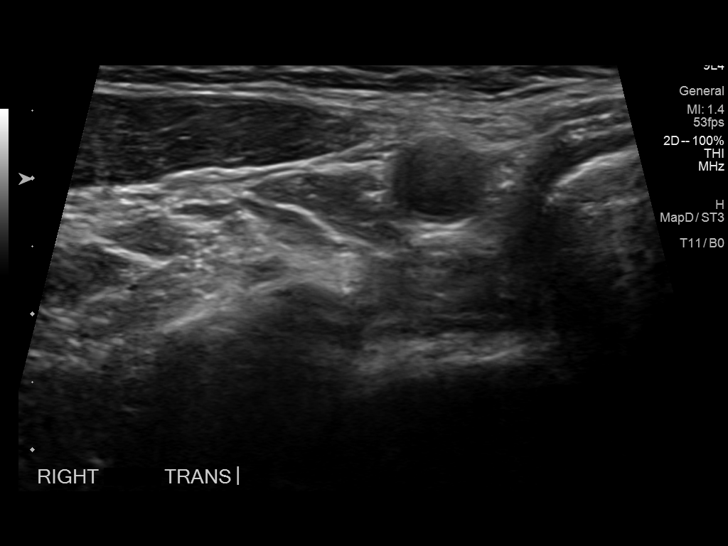
[im 29/64]
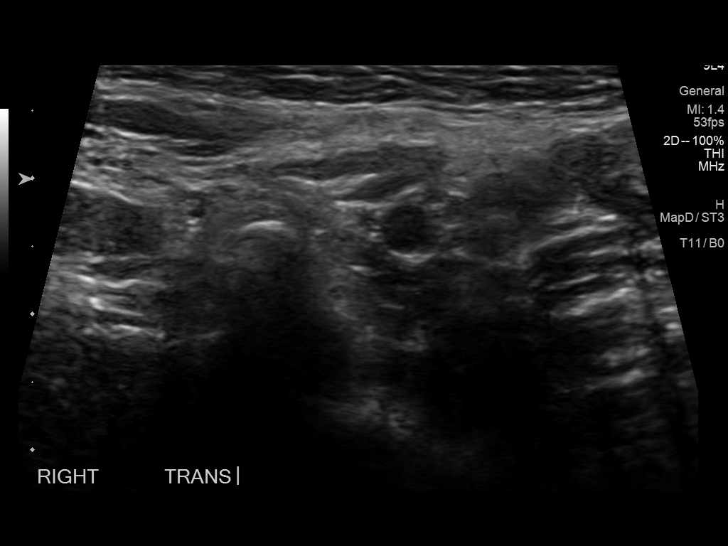
[im 35/64]
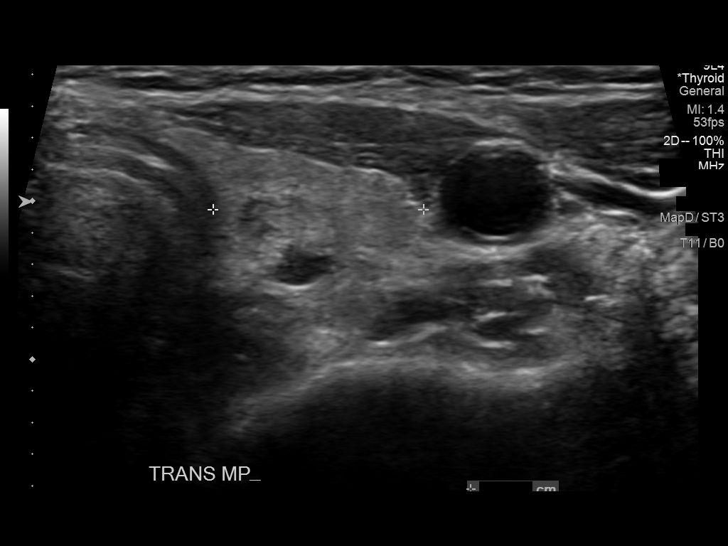
[im 40/64]
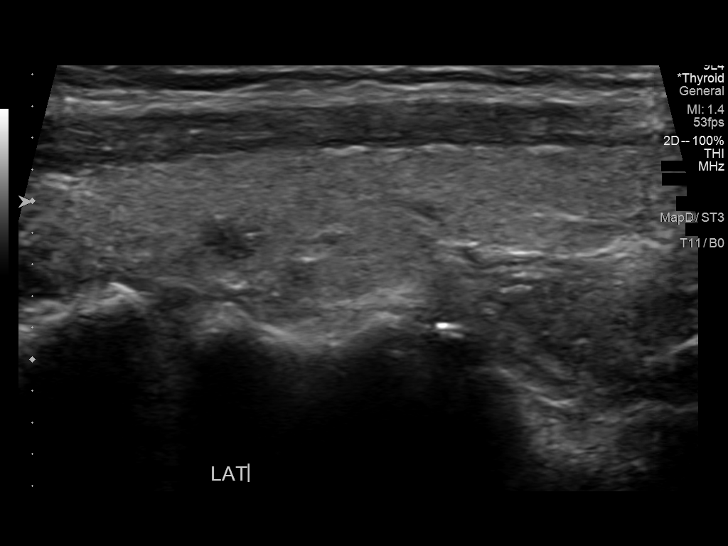
[im 43/64]
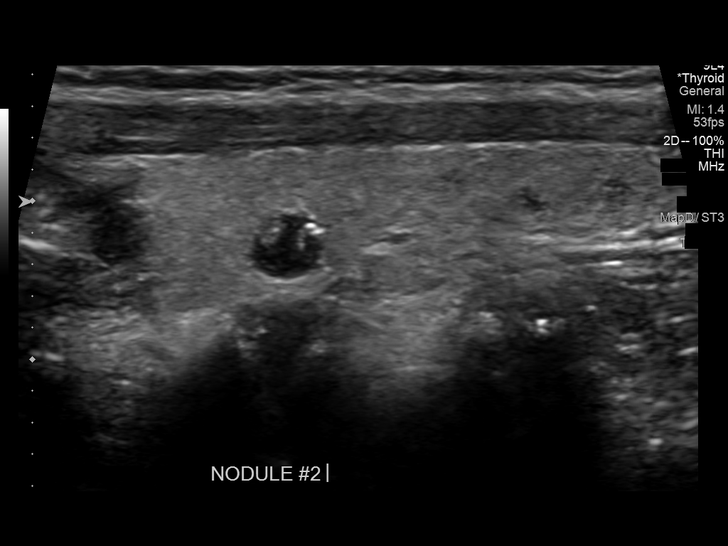
[im 48/64]
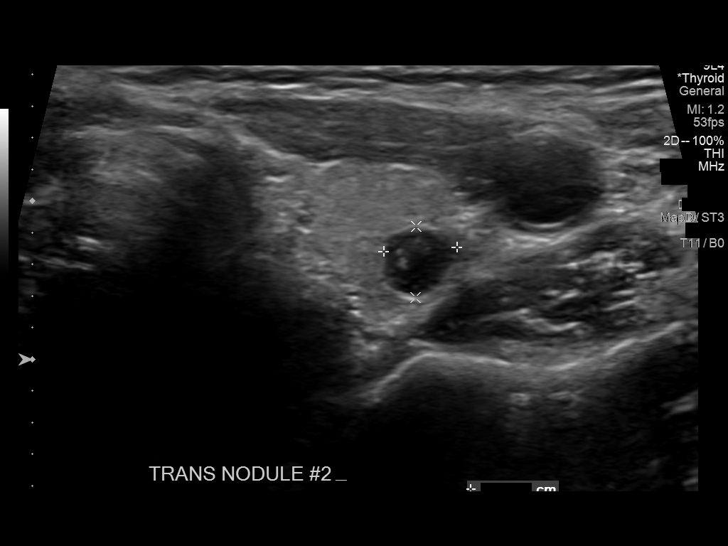
[im 53/64]
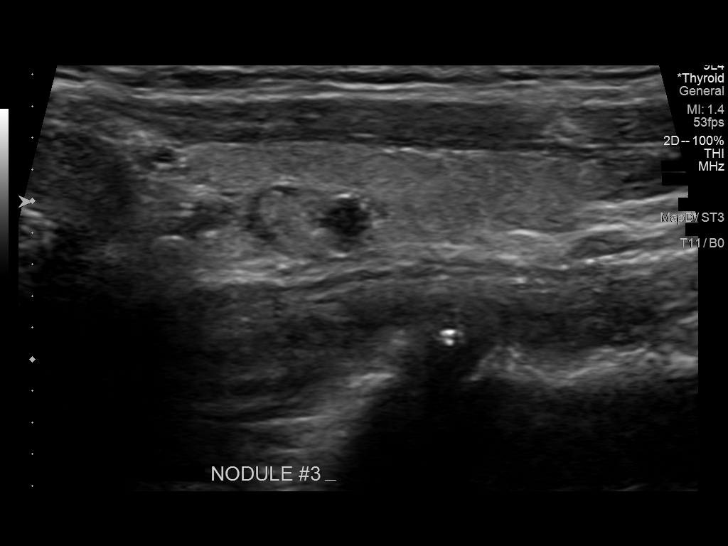
[im 58/64]
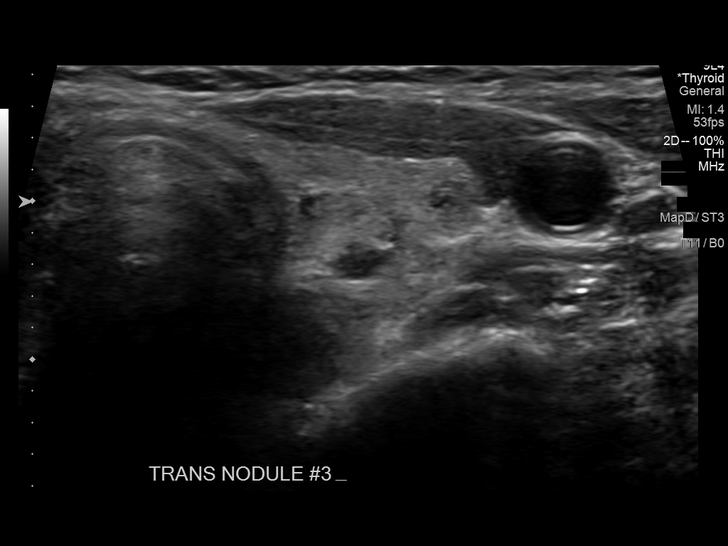
[im 64/64]
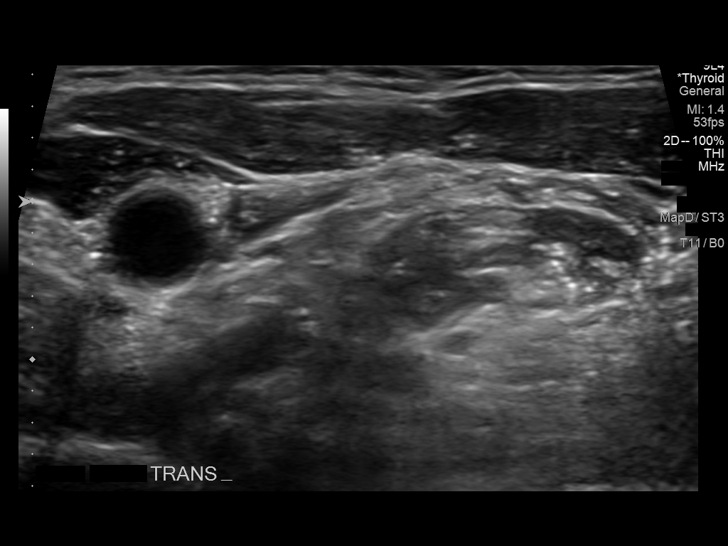

[14 of 25 positions shown; findings below may reference images not displayed]

FINDINGS: Parenchymal Echotexture: Mildly heterogenous

Isthmus: 0.2 cm

Right lobe: 4.2 x 1.3 x 1.4 cm

Left lobe: 3.9 x 1.0 x 1.3 cm

_________________________________________________________

Estimated total number of nodules >/= 1 cm: 0

Number of spongiform nodules >/=  2 cm not described below (TR1): 0

Number of mixed cystic and solid nodules >/= 1.5 cm not described
below (TR2): 0

_________________________________________________________

0.5 cm cystic nodule in the right thyroid lobe.

Complex cystic nodule in the superior left thyroid lobe measures up
to 0.5 cm. This nodule appears to have inspissated colloid.

Mixed cystic and solid nodule in the mid left thyroid lobe. The
solid component is echogenic. Nodule measures up to 0.8 cm.
IMPRESSION: Thyroid tissue is mildly heterogeneous with small nodules. These
nodules do not meet criteria for biopsy or dedicated follow-up.

The above is in keeping with the ACR TI-RADS recommendations - [HOSPITAL] 1441;[DATE].
